# Patient Record
Sex: Female | Born: 2000 | Race: Black or African American | Hispanic: No | Marital: Single | Smoking: Never smoker
Health system: Southern US, Community
[De-identification: ages and names within clinical notes are randomized; demographics above are authoritative.]

## PROBLEM LIST (undated history)

## (undated) DIAGNOSIS — A749 Chlamydial infection, unspecified: Secondary | ICD-10-CM

---
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
## 2022-08-29 ENCOUNTER — Encounter (INDEPENDENT_AMBULATORY_CARE_PROVIDER_SITE_OTHER): Payer: Self-pay

## 2022-12-23 ENCOUNTER — Encounter: Payer: Self-pay | Admitting: Physician Assistant

## 2023-01-20 ENCOUNTER — Encounter: Payer: 59 | Admitting: Family

## 2023-01-20 NOTE — Progress Notes (Signed)
Erroneous encounter-disregard

## 2023-03-17 ENCOUNTER — Ambulatory Visit
Admission: EM | Admit: 2023-03-17 | Discharge: 2023-03-17 | Disposition: A | Discharge status: Home / Self Care | Payer: Self-pay | Attending: Family Medicine | Admitting: Family Medicine

## 2023-03-17 ENCOUNTER — Other Ambulatory Visit: Payer: Self-pay

## 2023-03-17 ENCOUNTER — Encounter: Payer: Self-pay | Admitting: Emergency Medicine

## 2023-03-17 DIAGNOSIS — N3090 Cystitis, unspecified without hematuria: Secondary | ICD-10-CM | POA: Insufficient documentation

## 2023-03-17 DIAGNOSIS — M545 Low back pain, unspecified: Secondary | ICD-10-CM | POA: Insufficient documentation

## 2023-03-17 DIAGNOSIS — R509 Fever, unspecified: Secondary | ICD-10-CM | POA: Insufficient documentation

## 2023-03-17 DIAGNOSIS — R051 Acute cough: Secondary | ICD-10-CM | POA: Insufficient documentation

## 2023-03-17 LAB — POCT URINALYSIS DIP (MANUAL ENTRY)
Bilirubin, UA: NEGATIVE
Glucose, UA: NEGATIVE mg/dL
Leukocytes, UA: NEGATIVE
Nitrite, UA: POSITIVE — AB
Protein Ur, POC: 30 mg/dL — AB
Spec Grav, UA: 1.025 (ref 1.010–1.025)
Urobilinogen, UA: 1 U/dL
pH, UA: 6.5 (ref 5.0–8.0)

## 2023-03-17 LAB — POC COVID19/FLU A&B COMBO
Covid Antigen, POC: NEGATIVE
Influenza A Antigen, POC: NEGATIVE
Influenza B Antigen, POC: NEGATIVE

## 2023-03-17 MED ORDER — PROMETHAZINE-DM 6.25-15 MG/5ML PO SYRP: 5.0000 mL | ORAL_SOLUTION | Freq: Four times a day (QID) | ORAL | 0 refills | Status: DC | PRN

## 2023-03-17 MED ORDER — ACETAMINOPHEN 325 MG PO TABS
650.0000 mg | ORAL_TABLET | Freq: Once | ORAL | Status: AC
  Administered 2023-03-17: 650 mg via ORAL

## 2023-03-17 MED ORDER — CEPHALEXIN 500 MG PO CAPS: 500.0000 mg | ORAL_CAPSULE | Freq: Two times a day (BID) | ORAL | 0 refills | Status: DC

## 2023-03-17 NOTE — ED Provider Notes (Signed)
[facility]   [id number] 03/17/23 Arrival Time: 0810  ASSESSMENT & PLAN:  1. Fever, unspecified fever cause   2. Acute cough   3. Acute bilateral low back pain without sciatica   4. Cystitis    Discussed typical duration of likely viral illness + UTI.  Results for orders placed or performed during the hospital encounter of 03/17/23  POC Covid19/Flu A&B Antigen   Collection Time: 03/17/23  9:22 AM  Result Value Ref Range   Influenza A Antigen, POC Negative    Influenza B Antigen, POC Negative    Covid Antigen, POC Negative   POCT urinalysis dipstick   Collection Time: 03/17/23  9:49 AM  Result Value Ref Range   Color, UA yellow yellow   Clarity, UA clear clear   Glucose, UA negative negative mg/dL   Bilirubin, UA negative negative   Ketones, POC UA trace (5) (A) negative mg/dL   Spec Grav, UA 7.829 5.621 - 1.025   Blood, UA trace-intact (A) negative   pH, UA 6.5 5.0 - 8.0   Protein Ur, POC =30 (A) negative mg/dL   Urobilinogen, UA 1.0 0.2 or 1.0 E.U./dL   Nitrite, UA Positive (A) Negative   Leukocytes, UA Negative Negative   OTC symptom care as needed. Begin: New Prescriptions   CEPHALEXIN (KEFLEX) 500 MG CAPSULE    Take 1 capsule (500 mg total) by mouth 2 (two) times daily.   PROMETHAZINE-DEXTROMETHORPHAN (PROMETHAZINE-DM) 6.25-15 MG/5ML SYRUP    Take 5 mLs by mouth 4 (four) times daily as needed for cough.   Urine culture pending.   Follow-up Information     [facility].   Specialty: Emergency Medicine Why: If symptoms worsen in any way. Contact information: [address] [place] [address] [phone]                Reviewed expectations re: course of current medical issues. Questions answered. Outlined signs and symptoms indicating need for more acute intervention. Understanding verbalized. After Visit Summary given.   SUBJECTIVE: History from: Patient. Yvette Shelton is a 23 y.o. female. Reports: body aches, nasal congestion, cough, LBP; mild nausea; denies emesis. Denies: difficulty breathing. Normal PO intake without n/v/d. Friend here with same symptoms. Denies dysuria.  OBJECTIVE:  Vitals:   03/17/23 0902  BP: 96/60  Pulse: 92  Resp: 18  Temp: (!) 100.7 F (38.2 C)  TempSrc: Oral  SpO2: 100%    General appearance: alert; no distress Eyes: PERRLA; EOMI; conjunctiva normal HENT: [place]; AT; with nasal congestion Neck: supple  Lungs: speaks full sentences without difficulty; unlabored; CTAB Abd: soft Extremities: no edema Skin: warm and dry Neurologic: normal gait Psychological: alert and cooperative; normal mood and affect  Labs: Results for orders placed or performed during the hospital encounter of 03/17/23  POC Covid19/Flu A&B Antigen   Collection Time: 03/17/23  9:22 AM  Result Value Ref Range   Influenza A Antigen, POC Negative    Influenza B Antigen, POC Negative    Covid Antigen, POC Negative   POCT urinalysis dipstick   Collection Time: 03/17/23  9:49 AM  Result Value Ref Range   Color, UA yellow yellow   Clarity, UA clear clear   Glucose, UA negative negative mg/dL   Bilirubin, UA negative negative   Ketones, POC UA trace (5) (A) negative mg/dL   Spec Grav, UA 8.413 2.440 - 1.025   Blood, UA trace-intact (A) negative   pH, UA  6.5 5.0 - 8.0   Protein Ur, POC =30 (A) negative mg/dL   Urobilinogen, UA 1.0 0.2 or 1.0 E.U./dL   Nitrite, UA Positive (A) Negative   Leukocytes, UA Negative Negative   Labs Reviewed  POCT URINALYSIS DIP (MANUAL ENTRY) - Abnormal; Notable for the following components:      Result Value   Ketones, POC UA trace (5) (*)    Blood, UA trace-intact (*)    Protein Ur, POC =30 (*)    Nitrite, UA Positive (*)    All other components within normal limits  POC COVID19/FLU A&B COMBO - Normal  URINE CULTURE    Imaging: No results found.  No Known Allergies  History reviewed. No pertinent  past medical history. Social History   Socioeconomic History   Marital status: Single    Spouse name: Not on file   Number of children: Not on file   Years of education: Not on file   Highest education level: Not on file  Occupational History   Not on file  Tobacco Use   Smoking status: Never    Passive exposure: Yes   Smokeless tobacco: Never  Substance and Sexual Activity   Alcohol use: Never   Drug use: Never   Sexual activity: Not on file  Other Topics Concern   Not on file  Social History Narrative   Not on file   Social Drivers of Health   Financial Resource Strain: Not on File (08/16/2021)   Received from [employer], [place] Strain    Financial Resource Strain: 0  Food Insecurity: Not at Risk (12/23/2022)   Received from [facility] Insecurity    Food: 1  Transportation Needs: Not at Risk (12/23/2022)   Received from [employer] Needs    Transportation: 1  Physical Activity: Not on File (08/16/2021)   Received from [place], [place]   Physical Activity    Physical Activity: 0  Stress: Not on File (08/16/2021)   Received from [facility], [place]   Stress    Stress: 0  Social Connections: Not on File (11/08/2022)   Received from [employer]   Social Connections    Connectedness: 0  Intimate Partner Violence: Not on file   History reviewed. No pertinent family history. History reviewed. No pertinent surgical history.   Mardella Layman, MD 03/17/23 1028

## 2023-03-17 NOTE — Discharge Instructions (Addendum)
Caring for yourself: Get plenty of rest. Drink plenty of fluids, enough so that your urine is light yellow or clear like water. If you have kidney, heart, or liver disease and have to limit fluids, talk with your doctor before you increase the amount of fluids you drink. Take an over-the-counter pain medicine if needed, such as acetaminophen (Tylenol), ibuprofen (Advil, Motrin), or naproxen (Aleve), to relieve fever, headache, and muscle aches. Read and follow all instructions on the label. No one younger than 20 should take aspirin. It has been linked to Reye syndrome, a serious illness. Before you use over the counter cough and cold medicines, check the label. These medicines may not be safe for children younger than age 77 or for people with certain health problems. If the skin around your nose and lips becomes sore, put some petroleum jelly on the area.  Avoid spreading a respiratory virus: Wash your hands regularly, and keep your hands away from your face.  Stay home from school, work, and other public places until you are feeling better and your fever has been gone for at least 24 hours. The fever needs to have gone away on its own without the help of medicine.  You have had labs (urine culture) sent today. We will call you with any significant abnormalities or if there is need to begin or change treatment or pursue further follow up.  You may also review your test results online through MyChart. If you do not have a MyChart account, instructions to sign up should be on your discharge paperwork.

## 2023-03-17 NOTE — ED Triage Notes (Signed)
Pt here for body aches and nasal congestion x 2 days; pt sts some nausea

## 2023-03-19 LAB — URINE CULTURE: Culture: >=100000 — AB

## 2023-05-14 ENCOUNTER — Ambulatory Visit: Payer: Self-pay

## 2023-11-22 ENCOUNTER — Ambulatory Visit (HOSPITAL_COMMUNITY): Payer: Self-pay

## 2024-01-01 ENCOUNTER — Ambulatory Visit
Admission: EM | Admit: 2024-01-01 | Discharge: 2024-01-01 | Disposition: A | Discharge status: Home / Self Care | Payer: Self-pay | Attending: Family Medicine | Admitting: Family Medicine

## 2024-01-01 DIAGNOSIS — Z113 Encounter for screening for infections with a predominantly sexual mode of transmission: Secondary | ICD-10-CM | POA: Insufficient documentation

## 2024-01-01 DIAGNOSIS — Z7251 High risk heterosexual behavior: Secondary | ICD-10-CM | POA: Insufficient documentation

## 2024-01-01 LAB — POCT URINE PREGNANCY: Preg Test, Ur: NEGATIVE

## 2024-01-01 NOTE — ED Provider Notes (Signed)
 [facility]  Note:  This document was prepared using Conservation officer, historic buildings and may include unintentional dictation errors.  MRN: [id number] DOB: 2000/07/11  Subjective:   KIOWA PEIFER is a 23 y.o. female presenting for concerns for testing after having unprotected sex a week ago.  LMP was 12/16/2023.  Denies fever, n/v, abdominal pain, pelvic pain, rashes, dysuria, urinary frequency, hematuria, vaginal discharge.  Would like an urine pregnancy test.  No current facility-administered medications for this encounter.  Current Outpatient Medications:    cephALEXin  (KEFLEX ) 500 MG capsule, Take 1 capsule (500 mg total) by mouth 2 (two) times daily., Disp: 10 capsule, Rfl: 0   dicyclomine  (BENTYL ) 20 MG tablet, Take 1 tablet (20 mg total) by mouth 2 (two) times daily., Disp: 20 tablet, Rfl: 0   JUNEL FE 1/20 1-20 MG-MCG tablet, Take 1 tablet by mouth daily., Disp: , Rfl:    promethazine -dextromethorphan (PROMETHAZINE -DM) 6.25-15 MG/5ML syrup, Take 5 mLs by mouth 4 (four) times daily as needed for cough., Disp: 118 mL, Rfl: 0   triamcinolone cream (KENALOG) 0.1 %, Apply topically 2 (two) times daily., Disp: , Rfl:    No Known Allergies  History reviewed. No pertinent past medical history.   History reviewed. No pertinent surgical history.  No family history on file.  Social History   Tobacco Use   Smoking status: Never    Passive exposure: Yes   Smokeless tobacco: Never  Vaping Use   Vaping status: Never Used  Substance Use Topics   Alcohol use: Never   Drug use: Yes    Types: Marijuana    ROS   Objective:   Vitals: BP 115/77 (BP Location: Right Arm)   Pulse 60   Temp 98.1 F (36.7 C) (Oral)   Resp 16   LMP 12/16/2023   SpO2 97%   Physical Exam Constitutional:      General: She is not in acute distress.    Appearance: Normal appearance. She is well-developed. She is not ill-appearing, toxic-appearing or diaphoretic.  HENT:      Head: Normocephalic and atraumatic.     Nose: Nose normal.     Mouth/Throat:     Mouth: Mucous membranes are moist.  Eyes:     General: No scleral icterus.       Right eye: No discharge.        Left eye: No discharge.     Extraocular Movements: Extraocular movements intact.  Cardiovascular:     Rate and Rhythm: Normal rate.  Pulmonary:     Effort: Pulmonary effort is normal.  Skin:    General: Skin is warm and dry.  Neurological:     General: No focal deficit present.     Mental Status: She is alert and oriented to person, place, and time.  Psychiatric:        Mood and Affect: Mood normal.        Behavior: Behavior normal.     Results for orders placed or performed during the hospital encounter of 01/01/24 (from the past 24 hours)  POCT urine pregnancy     Status: None   Collection Time: 01/01/24  5:27 PM  Result Value Ref Range   Preg Test, Ur Negative Negative    Assessment and Plan :   PDMP not reviewed this encounter.  1. Screen for STD (sexually transmitted disease)   2. Unprotected sex    As patient is asymptomatic, will not run and urinalysis or and urine  culture.  STI check pending, will treat as appropriate based off of results.  She declined HIV and RPR testing.   Christopher Savannah, [place] 01/01/24 1752

## 2024-01-01 NOTE — ED Triage Notes (Signed)
 Pt requesting STD testing-denies sx/known exposure-NAD-steady gait

## 2024-01-01 NOTE — Discharge Instructions (Addendum)
 Your test results should be available tomorrow.  Our results team will let you know if you need any kind of treatment for any positive test results.

## 2024-01-04 LAB — CERVICOVAGINAL ANCILLARY ONLY
Chlamydia: POSITIVE — AB
Comment: NEGATIVE
Comment: NEGATIVE
Comment: NORMAL
Neisseria Gonorrhea: NEGATIVE
Trichomonas: NEGATIVE

## 2024-01-05 ENCOUNTER — Ambulatory Visit (HOSPITAL_COMMUNITY): Payer: Self-pay

## 2024-01-05 MED ORDER — DOXYCYCLINE HYCLATE 100 MG PO TABS: 100.0000 mg | ORAL_TABLET | Freq: Two times a day (BID) | ORAL | 0 refills | Status: AC

## 2024-01-06 LAB — MISC LABCORP TEST (SEND OUT): Labcorp test code: 180076

## 2024-01-11 ENCOUNTER — Encounter (HOSPITAL_COMMUNITY): Payer: Self-pay | Admitting: Emergency Medicine

## 2024-01-11 ENCOUNTER — Ambulatory Visit (HOSPITAL_COMMUNITY)
Admission: EM | Admit: 2024-01-11 | Discharge: 2024-01-11 | Disposition: A | Discharge status: Home / Self Care | Payer: Self-pay | Attending: Emergency Medicine | Admitting: Emergency Medicine

## 2024-01-11 DIAGNOSIS — R519 Headache, unspecified: Secondary | ICD-10-CM

## 2024-01-11 DIAGNOSIS — R112 Nausea with vomiting, unspecified: Secondary | ICD-10-CM

## 2024-01-11 HISTORY — DX: Chlamydial infection, unspecified: A74.9

## 2024-01-11 MED ORDER — ONDANSETRON 4 MG PO TBDP
ORAL_TABLET | ORAL | Status: AC
  Filled 2024-01-11: qty 1

## 2024-01-11 MED ORDER — IBUPROFEN 800 MG PO TABS
800.0000 mg | ORAL_TABLET | Freq: Once | ORAL | Status: AC
  Administered 2024-01-11: 800 mg via ORAL

## 2024-01-11 MED ORDER — KETOROLAC TROMETHAMINE 30 MG/ML IJ SOLN: 30.0000 mg | Freq: Once | INTRAMUSCULAR | Status: DC

## 2024-01-11 MED ORDER — DEXAMETHASONE SOD PHOSPHATE PF 10 MG/ML IJ SOLN: 10.0000 mg | Freq: Once | INTRAMUSCULAR | Status: DC

## 2024-01-11 MED ORDER — IBUPROFEN 800 MG PO TABS
ORAL_TABLET | ORAL | Status: AC
  Filled 2024-01-11: qty 1

## 2024-01-11 MED ORDER — ONDANSETRON 4 MG PO TBDP: 4.0000 mg | ORAL_TABLET | Freq: Once | ORAL | Status: DC

## 2024-01-11 MED ORDER — METOCLOPRAMIDE HCL 5 MG/ML IJ SOLN: 5.0000 mg | Freq: Once | INTRAMUSCULAR | Status: DC

## 2024-01-11 NOTE — ED Notes (Signed)
 Pt given warm blanket.

## 2024-01-11 NOTE — Discharge Instructions (Addendum)
 You were offered a series of medications today to help with your headache, but declined these today.  Instead you were given 800 mg ibuprofen for your headache and Zofran for nausea and vomiting.  You can continue alternating between ibuprofen and Tylenol  every 6-8 hours as needed for pain.  Please wait at least 8 hours before taking any additional ibuprofen after dose given here today.  If your headache persists, worsens, you develop worsening vision changes, severe dizziness, passing out, weakness, numbness, chest pain, confusion, and slurred speech please seek immediate medical treatment in the emergency department.

## 2024-01-11 NOTE — ED Triage Notes (Signed)
 Pt having headache, chills, vomiting that started this morning. Hasn't taken anything for symptoms.

## 2024-01-11 NOTE — ED Notes (Signed)
 Mother of patient at nurse's desk asking for sprite. This RN provided her with ice cup and ginger can.

## 2024-01-11 NOTE — ED Notes (Signed)
 Patient provided a second warm blanket

## 2024-01-11 NOTE — ED Notes (Signed)
Patient refused covid and flu testing

## 2024-01-11 NOTE — ED Provider Notes (Signed)
 [facility]    CSN: [id number] Arrival date & time: 01/11/24  1217      History   Chief Complaint Chief Complaint  Patient presents with   Headache   Chills    HPI Yvette Shelton is a 23 y.o. female.   Patient presents with mother for severe headache that began upon waking this morning.  Patient states that her headache is also causing her to have some nausea and she has vomited a few times since this morning.  Patient also endorses bilateral mild blurred vision and photophobia.  Patient also reports severe chills.  Denies weakness, dizziness, numbness, confusion, slurred speech, and facial droop.  Denies cough, congestion, chest pain, shortness of breath, diarrhea, abdominal pain, and known fever.  Patient denies any history of migraines or bad headaches.  Patient denies any known sick exposures.  Patient denies taking anything for her symptoms.  Upon entering the room patient is covered in multiple blankets due to being extremely cold and having chills.  Patient states her covering her eyes and head with a hat and blanket as well with the lights off.  The history is provided by the patient, medical records and a relative.  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 No pertinent surgical history.  OB History   No obstetric history on file.      Home Medications    Prior to Admission medications   Medication Sig Start Date End Date Taking? Authorizing Provider  doxycycline (VIBRA-TABS) 100 MG tablet Take 1 tablet (100 mg total) by mouth 2 (two) times daily for 7 days. 01/05/24 01/12/24  Banister, Pamela K, MD  JUNEL FE 1/20 1-20 MG-MCG tablet Take 1 tablet by mouth daily. 09/01/19   [provider]    Family History No family history on file.  Social History Social History   Tobacco Use   Smoking status: Never    Passive exposure: Yes   Smokeless tobacco: Never  Vaping Use   Vaping status: Never Used  Substance Use Topics   Alcohol use: Never   Drug use: Yes    Types: Marijuana     Allergies   Patient has no known allergies.   Review of Systems Review of Systems  Neurological:  Positive for headaches.   Per HPI  Physical Exam Triage Vital Signs ED Triage Vitals  Encounter Vitals Group     BP 01/11/24 1342 107/77     Girls Systolic BP Percentile --      Girls Diastolic BP Percentile --      Boys Systolic BP Percentile --      Boys Diastolic BP Percentile --      Pulse Rate 01/11/24 1342 79     Resp 01/11/24 1342 18     Temp 01/11/24 1342 98 F (36.7 C)     Temp Source 01/11/24 1342 Oral     SpO2 01/11/24 1342 96 %     Weight --      Height --      Head Circumference --      Peak Flow --      Pain Score 01/11/24 1340 8     Pain Loc --  Pain Education --      Exclude from Growth Chart --    No data found.  Updated Vital Signs BP 107/77 (BP Location: Left Arm)   Pulse 79   Temp 98 F (36.7 C) (Oral)   Resp 18   LMP 01/08/2024 (Exact Date)   SpO2 96%   Visual Acuity Right Eye Distance:   Left Eye Distance:   Bilateral Distance:    Right Eye Near:   Left Eye Near:    Bilateral Near:     Physical Exam Vitals and nursing note reviewed.  Constitutional:      General: She is awake. She is not in acute distress.    Appearance: Normal appearance. She is well-developed and well-groomed. She is not ill-appearing.  HENT:     Head: Normocephalic.     Right Ear: Tympanic membrane, ear canal and external ear normal.     Left Ear: Tympanic membrane, ear canal and external ear normal.     Nose: Nose normal.     Mouth/Throat:     Mouth: Mucous membranes are moist.     Pharynx: Oropharynx is clear.  Eyes:     Extraocular  Movements: Extraocular movements intact.     Conjunctiva/sclera: Conjunctivae normal.     Pupils: Pupils are equal, round, and reactive to light.  Cardiovascular:     Rate and Rhythm: Normal rate and regular rhythm.  Pulmonary:     Effort: Pulmonary effort is normal.     Breath sounds: Normal breath sounds.  Abdominal:     General: Abdomen is flat. Bowel sounds are normal. There is no distension.     Palpations: Abdomen is soft.     Tenderness: There is no abdominal tenderness. There is no right CVA tenderness, left CVA tenderness, guarding or rebound.  Musculoskeletal:        General: Normal range of motion.     Cervical back: Normal range of motion and neck supple.  Skin:    General: Skin is warm and dry.  Neurological:     General: No focal deficit present.     Mental Status: She is alert and oriented to person, place, and time. Mental status is at baseline.     GCS: GCS eye subscore is 4. GCS verbal subscore is 5. GCS motor subscore is 6.     Cranial Nerves: Cranial nerves 2-12 are intact.     Sensory: Sensation is intact.     Motor: Motor function is intact.     Coordination: Coordination is intact.     Gait: Gait is intact.  Psychiatric:        Behavior: Behavior is cooperative.      UC Treatments / Results  Labs (all labs ordered are listed, but only abnormal results are displayed) Labs Reviewed - No data to display  EKG   Radiology No results found.  Procedures Procedures (including critical care time)  Medications Ordered in UC Medications  ondansetron (ZOFRAN-ODT) disintegrating tablet 4 mg (4 mg Oral Patient Refused/Not Given 01/11/24 1446)  ibuprofen (ADVIL) tablet 800 mg (800 mg Oral Given 01/11/24 1446)    Initial Impression / Assessment and Plan / UC Course  I have reviewed the triage vital signs and the nursing notes.  Pertinent labs & imaging results that were available during my care of the patient were reviewed by me and considered in my  medical decision making (see chart for details).     Patient appears to be in acute pain and  having severe photosensitivity.  Patient is not actively vomiting at this time but does endorse some mild nausea.  No other significant findings on exam.  EOMI and PERRLA.  GCS 15.  No neurodeficits noted.  Offered migraine cocktail consisting of Toradol, Decadron, and Reglan to clinic and patient and mother were initially agreeable to this and shortly after I left the room they reported that they no longer wanted these medications.  Offered patient ibuprofen and ODT Zofran which patient initially agreed to.  Patient did take the ibuprofen but then declined the Zofran when the nurse went to give it to her.  Recommended patient alternate between ibuprofen and Tylenol  as needed for bad headache.  Discussed follow-up, return, and strict ER precautions. Final Clinical Impressions(s) / UC Diagnoses   Final diagnoses:  Bad headache  Nausea and vomiting, unspecified vomiting type     Discharge Instructions      You were offered a series of medications today to help with your headache, but declined these today.  Instead you were given 800 mg ibuprofen for your headache and Zofran for nausea and vomiting.  You can continue alternating between ibuprofen and Tylenol  every 6-8 hours as needed for pain.  Please wait at least 8 hours before taking any additional ibuprofen after dose given here today.  If your headache persists, worsens, you develop worsening vision changes, severe dizziness, passing out, weakness, numbness, chest pain, confusion, and slurred speech please seek immediate medical treatment in the emergency department.    ED Prescriptions   None    PDMP not reviewed this encounter.   Johnie Flaming A, NP 01/11/24 [phone]

## 2024-01-15 ENCOUNTER — Ambulatory Visit
Admission: EM | Admit: 2024-01-15 | Discharge: 2024-01-15 | Disposition: A | Discharge status: Home / Self Care | Payer: Self-pay

## 2024-01-15 DIAGNOSIS — A749 Chlamydial infection, unspecified: Secondary | ICD-10-CM

## 2024-01-15 NOTE — ED Triage Notes (Signed)
 Pt requested STD test as she tested positive fro Chlamydia 1 week ago and never had symptoms. Pt denies any symptoms at this moment. Pt finished treatment on 01/13/24.

## 2024-01-15 NOTE — ED Provider Notes (Signed)
 Patient just completed treatment for chlamydia 2 days ago. Signed in to recheck for clearance of infection. I advised against this as the appropriate timeline would be 3-4 weeks after completing treatment. Patient is agreeable and will return in December.     Yvette Shelton, [place] 01/15/24 [id number]

## 2024-01-25 ENCOUNTER — Ambulatory Visit
Admission: EM | Admit: 2024-01-25 | Discharge: 2024-01-25 | Disposition: A | Discharge status: Home / Self Care | Payer: Self-pay | Attending: Family Medicine | Admitting: Family Medicine

## 2024-01-25 DIAGNOSIS — Z113 Encounter for screening for infections with a predominantly sexual mode of transmission: Secondary | ICD-10-CM | POA: Insufficient documentation

## 2024-01-25 NOTE — ED Provider Notes (Addendum)
  [facility]  Note:  This document was prepared using Conservation officer, historic buildings and may include unintentional dictation errors.  MRN: [id number] DOB: 2000-03-10  Subjective:   Yvette Shelton is a 23 y.o. female presenting for STI check.  Patient tested positive for chlamydia 01/01/2024.  Was asymptomatic at that time.  Denies fever, n/v, abdominal pain, pelvic pain, rashes, dysuria, urinary frequency, hematuria, vaginal discharge.  Would like to check for clearance of infection.  Does not want HIV or RPR testing.  No current facility-administered medications for this encounter.  Current Outpatient Medications:    JUNEL FE 1/20 1-20 MG-MCG tablet, Take 1 tablet by mouth daily., Disp: , Rfl:    No Known Allergies  Past Medical History:  Diagnosis Date   Chlamydia      History reviewed. No pertinent surgical history.  No family history on file.  Social History   Tobacco Use   Smoking status: Never    Passive exposure: Yes   Smokeless tobacco: Never  Vaping Use   Vaping status: Never Used  Substance Use Topics   Alcohol use: Never   Drug use: Yes    Types: Marijuana    ROS   Objective:   Vitals: BP 107/66 (BP Location: Right Arm)   Pulse 93   Temp 98.8 F (37.1 C) (Oral)   Resp 16   LMP 01/08/2024 (Exact Date)   SpO2 97%   Physical Exam Constitutional:      General: She is not in acute distress.    Appearance: Normal appearance. She is well-developed. She is not ill-appearing, toxic-appearing or diaphoretic.  HENT:     Head: Normocephalic and atraumatic.     Nose: Nose normal.     Mouth/Throat:     Mouth: Mucous membranes are moist.  Eyes:     General: No scleral icterus.       Right eye: No discharge.        Left eye: No discharge.     Extraocular Movements: Extraocular movements intact.  Cardiovascular:     Rate and Rhythm: Normal rate.  Pulmonary:     Effort: Pulmonary effort is normal.  Skin:    General: Skin is  warm and dry.  Neurological:     General: No focal deficit present.     Mental Status: She is alert and oriented to person, place, and time.  Psychiatric:        Mood and Affect: Mood normal.        Behavior: Behavior normal.     Assessment and Plan :   PDMP not reviewed this encounter.  1. Screening examination for STI    Recheck pending.     Christopher Savannah, [place] 01/25/24 [id number]

## 2024-01-25 NOTE — ED Triage Notes (Signed)
 Pt requesting STD testing-denies sx-states she wants to be retested for recent +test-NAD-steady gait

## 2024-01-26 LAB — CERVICOVAGINAL ANCILLARY ONLY
Chlamydia: NEGATIVE
Comment: NEGATIVE
Comment: NEGATIVE
Comment: NORMAL
Neisseria Gonorrhea: NEGATIVE
Trichomonas: NEGATIVE

## 2024-02-29 ENCOUNTER — Emergency Department (HOSPITAL_COMMUNITY)
Admission: EM | Admit: 2024-02-29 | Discharge: 2024-02-29 | Disposition: A | Discharge status: Home / Self Care | Payer: Self-pay

## 2024-02-29 ENCOUNTER — Other Ambulatory Visit: Payer: Self-pay

## 2024-02-29 DIAGNOSIS — M549 Dorsalgia, unspecified: Secondary | ICD-10-CM | POA: Insufficient documentation

## 2024-02-29 DIAGNOSIS — R10A2 Flank pain, left side: Secondary | ICD-10-CM | POA: Insufficient documentation

## 2024-02-29 DIAGNOSIS — U071 COVID-19: Secondary | ICD-10-CM | POA: Insufficient documentation

## 2024-02-29 LAB — URINALYSIS, ROUTINE W REFLEX MICROSCOPIC
Bacteria, UA: NONE SEEN
Bilirubin Urine: NEGATIVE
Glucose, UA: NEGATIVE mg/dL
Ketones, ur: 5 mg/dL — AB
Leukocytes,Ua: NEGATIVE
Nitrite: NEGATIVE
Protein, ur: NEGATIVE mg/dL
Specific Gravity, Urine: 1.021 (ref 1.005–1.030)
pH: 6 (ref 5.0–8.0)

## 2024-02-29 LAB — COMPREHENSIVE METABOLIC PANEL WITH GFR
ALT: 16 U/L (ref 0–44)
AST: 38 U/L (ref 15–41)
Albumin: 4.5 g/dL (ref 3.5–5.0)
Alkaline Phosphatase: 67 U/L (ref 38–126)
Anion gap: 10 (ref 5–15)
BUN: 7 mg/dL (ref 6–20)
CO2: 27 mmol/L (ref 22–32)
Calcium: 9.5 mg/dL (ref 8.9–10.3)
Chloride: 103 mmol/L (ref 98–111)
Creatinine, Ser: 0.8 mg/dL (ref 0.44–1.00)
GFR, Estimated: >60 mL/min
Glucose, Bld: 98 mg/dL (ref 70–99)
Potassium: 3.7 mmol/L (ref 3.5–5.1)
Sodium: 141 mmol/L (ref 135–145)
Total Bilirubin: 0.4 mg/dL (ref 0.0–1.2)
Total Protein: 8.3 g/dL — ABNORMAL HIGH (ref 6.5–8.1)

## 2024-02-29 LAB — CBC WITH DIFFERENTIAL/PLATELET
Abs Immature Granulocytes: 0.01 K/uL (ref 0.00–0.07)
Basophils Absolute: 0 K/uL (ref 0.0–0.1)
Basophils Relative: 0 %
Eosinophils Absolute: 0.1 K/uL (ref 0.0–0.5)
Eosinophils Relative: 2 %
HCT: 42.5 % (ref 36.0–46.0)
Hemoglobin: 14 g/dL (ref 12.0–15.0)
Immature Granulocytes: 0 %
Lymphocytes Relative: 33 %
Lymphs Abs: 1.4 K/uL (ref 0.7–4.0)
MCH: 30.6 pg (ref 26.0–34.0)
MCHC: 32.9 g/dL (ref 30.0–36.0)
MCV: 93 fL (ref 80.0–100.0)
Monocytes Absolute: 0.6 K/uL (ref 0.1–1.0)
Monocytes Relative: 15 %
Neutro Abs: 2.1 K/uL (ref 1.7–7.7)
Neutrophils Relative %: 50 %
Platelets: 268 K/uL (ref 150–400)
RBC: 4.57 MIL/uL (ref 3.87–5.11)
RDW: 12.5 % (ref 11.5–15.5)
WBC: 4.3 K/uL (ref 4.0–10.5)
nRBC: 0 % (ref 0.0–0.2)

## 2024-02-29 LAB — RESP PANEL BY RT-PCR (RSV, FLU A&B, COVID)  RVPGX2
Influenza A by PCR: NEGATIVE
Influenza B by PCR: NEGATIVE
Resp Syncytial Virus by PCR: NEGATIVE
SARS Coronavirus 2 by RT PCR: POSITIVE — AB

## 2024-02-29 LAB — LIPASE, BLOOD: Lipase: <10 U/L — ABNORMAL LOW (ref 11–51)

## 2024-02-29 LAB — HCG, SERUM, QUALITATIVE: Preg, Serum: NEGATIVE

## 2024-02-29 MED ORDER — LACTATED RINGERS IV BOLUS
1000.0000 mL | Freq: Once | INTRAVENOUS | Status: AC
  Administered 2024-02-29: 1000 mL via INTRAVENOUS

## 2024-02-29 MED ORDER — DIPHENHYDRAMINE HCL 50 MG/ML IJ SOLN
25.0000 mg | Freq: Once | INTRAMUSCULAR | Status: AC
  Administered 2024-02-29: 25 mg via INTRAVENOUS
  Filled 2024-02-29: qty 1

## 2024-02-29 MED ORDER — PROCHLORPERAZINE EDISYLATE 10 MG/2ML IJ SOLN
10.0000 mg | Freq: Once | INTRAMUSCULAR | Status: AC
  Administered 2024-02-29: 10 mg via INTRAVENOUS
  Filled 2024-02-29: qty 2

## 2024-02-29 MED ORDER — NAPROXEN 500 MG PO TABS: 500.0000 mg | ORAL_TABLET | Freq: Two times a day (BID) | ORAL | 0 refills | Status: AC

## 2024-02-29 NOTE — ED Triage Notes (Signed)
 Pt has c/o left lower back pain, headache and runny nose that began yesterday. Pt denies any sick contacts

## 2024-02-29 NOTE — Discharge Instructions (Addendum)
 Your labs were reassuring.  Urine did not show any infection.  Your COVID test today was positive.  Please take naproxen  as needed for body headaches/back pain/fever.  Please follow-up with your doctor and return to the ER for worsening symptoms.

## 2024-02-29 NOTE — ED Provider Notes (Signed)
 " [address] EMERGENCY DEPARTMENT AT [facility] Provider Note   CSN: [id number] Arrival date & time: 02/29/24  1248     Patient presents with: Back Pain and Nasal Congestion   JNIYAH DANTUONO is a 24 y.o. female.   24 year old female with past medical history of migraine headaches presenting to the emergency department today with nasal congestion, headache, and left-sided flank pain.  The patient states that she has been feeling unwell now for the past few days.  She states that she is having a global throbbing headache with some mild photophobia consistent with previous migraine headaches.  States this been going on for the past 2 to 3 days as well.  She denies any neck pain or stiffness.  Denies any fevers but has had some chills.  She reports that the headache will improve some with Tylenol  but then comes back.  She denies any focal weakness, numbness, or tingling.  She came to the ER today for further evaluation regarding this.   Back Pain      Prior to Admission medications  Medication Sig Start Date End Date Taking? Authorizing Provider  naproxen  (NAPROSYN ) 500 MG tablet Take 1 tablet (500 mg total) by mouth 2 (two) times daily. 02/29/24  Yes Ula Prentice SAUNDERS, MD  JUNEL FE 1/20 1-20 MG-MCG tablet Take 1 tablet by mouth daily. 09/01/19   [provider]    Allergies: Patient has no known allergies.    Review of Systems  HENT:  Positive for congestion.   Respiratory:  Positive for cough.   Musculoskeletal:  Positive for back pain.  All other systems reviewed and are negative.   Updated Vital Signs BP 121/80 (BP Location: Left Arm)   Pulse 68   Temp 98.6 F (37 C) (Oral)   Resp 16   SpO2 100%   Physical Exam Vitals and nursing note reviewed.   Gen: NAD Eyes: PERRL, EOMI HEENT: no oropharyngeal swelling Neck: trachea midline, no meningismus Resp: clear to auscultation bilaterally Card: RRR, no murmurs, rubs, or gallops Abd: nontender, nondistended,  left-sided CVA tenderness noted Extremities: no calf tenderness, no edema Vascular: 2+ radial pulses bilaterally, 2+ DP pulses bilaterally Neuro: Cranial nerves intact, equal strength sensation throughout bilateral upper and lower extremities with no dysmetria on finger-to-nose testing Skin: no rashes Psyc: acting appropriately   (all labs ordered are listed, but only abnormal results are displayed) Labs Reviewed  RESP PANEL BY RT-PCR (RSV, FLU A&B, COVID)  RVPGX2 - Abnormal; Notable for the following components:      Result Value   SARS Coronavirus 2 by RT PCR POSITIVE (*)    All other components within normal limits  COMPREHENSIVE METABOLIC PANEL WITH GFR - Abnormal; Notable for the following components:   Total Protein 8.3 (*)    All other components within normal limits  LIPASE, BLOOD - Abnormal; Notable for the following components:   Lipase <10 (*)    All other components within normal limits  URINALYSIS, ROUTINE W REFLEX MICROSCOPIC - Abnormal; Notable for the following components:   Hgb urine dipstick SMALL (*)    Ketones, ur 5 (*)    All other components within normal limits  CBC WITH DIFFERENTIAL/PLATELET  HCG, SERUM, QUALITATIVE    EKG: None  Radiology: No results found.   Procedures   Medications Ordered in the ED  prochlorperazine  (COMPAZINE ) injection 10 mg (10 mg Intravenous Given 02/29/24 1556)  diphenhydrAMINE  (BENADRYL ) injection 25 mg (25 mg Intravenous Given 02/29/24 1559)  lactated ringers  bolus 1,000 mL (1,000 mLs Intravenous New Bag/Given 02/29/24 1601)                                    Medical Decision Making 24 year old female with past medical history of migraine headaches presenting to the emergency department today with cough, nasal congestion, and left-sided flank pain.  I will further evaluate patient here with a flu swab.  Given her headache will give the patient Compazine  and Benadryl  as this is consistent with previous migraine headaches.   May also be due to sinus headache or the flu.  Given her left-sided CVA tenderness will obtain a urinalysis to evaluate for pyelonephritis.  She reports the pain is relatively mild so suspicion for kidney stone is relatively low at this time.  I will reevaluate for ultimate disposition.  She does not have any findings on exam consistent with meningitis at this time.  The patient's labs are reassuring.  Urinalysis is negative.  Her COVID test is positive.  The patient remained stable.  Will discharge with return precautions as well as symptomatic treatment.  Amount and/or Complexity of Data Reviewed Labs: ordered.  Risk Prescription drug management.        Final diagnoses:  COVID-19    ED Discharge Orders          Ordered    naproxen  (NAPROSYN ) 500 MG tablet  2 times daily        02/29/24 1743               Ula Prentice SAUNDERS, MD 02/29/24 1745  "
